# Patient Record
Sex: Male | Born: 1960 | Race: White | Hispanic: No | Marital: Married | Smoking: Former smoker
Health system: Southern US, Community
[De-identification: ages and names within clinical notes are randomized; demographics above are authoritative.]

## PROBLEM LIST (undated history)

## (undated) DIAGNOSIS — Z860100 Personal history of colon polyps, unspecified: Secondary | ICD-10-CM

## (undated) DIAGNOSIS — E7800 Pure hypercholesterolemia, unspecified: Secondary | ICD-10-CM

## (undated) DIAGNOSIS — M5430 Sciatica, unspecified side: Secondary | ICD-10-CM

## (undated) DIAGNOSIS — M109 Gout, unspecified: Secondary | ICD-10-CM

## (undated) DIAGNOSIS — Z860101 Personal history of adenomatous and serrated colon polyps: Secondary | ICD-10-CM

## (undated) DIAGNOSIS — I10 Essential (primary) hypertension: Secondary | ICD-10-CM

## (undated) DIAGNOSIS — E669 Obesity, unspecified: Secondary | ICD-10-CM

## (undated) DIAGNOSIS — R7303 Prediabetes: Secondary | ICD-10-CM

## (undated) DIAGNOSIS — F439 Reaction to severe stress, unspecified: Secondary | ICD-10-CM

## (undated) HISTORY — PX: BASAL CELL CARCINOMA EXCISION: SHX1214

## (undated) HISTORY — DX: Pure hypercholesterolemia, unspecified: E78.00

## (undated) HISTORY — DX: Personal history of adenomatous and serrated colon polyps: Z86.0101

## (undated) HISTORY — PX: CATARACT EXTRACTION: SUR2

## (undated) HISTORY — DX: Personal history of colon polyps, unspecified: Z86.0100

## (undated) HISTORY — DX: Sciatica, unspecified side: M54.30

## (undated) HISTORY — DX: Prediabetes: R73.03

## (undated) HISTORY — DX: Obesity, unspecified: E66.9

## (undated) HISTORY — PX: COLONOSCOPY: SHX174

## (undated) HISTORY — DX: Reaction to severe stress, unspecified: F43.9

## (undated) HISTORY — DX: Essential (primary) hypertension: I10

---

## 2010-11-26 HISTORY — PX: CATARACT EXTRACTION: SUR2

## 2014-01-24 ENCOUNTER — Ambulatory Visit (HOSPITAL_BASED_OUTPATIENT_CLINIC_OR_DEPARTMENT_OTHER)
Admission: RE | Admit: 2014-01-24 | Discharge: 2014-01-24 | Disposition: A | Discharge status: Home / Self Care | Payer: BC Managed Care – PPO | Source: Ambulatory Visit | Attending: Family Medicine | Admitting: Family Medicine

## 2014-01-24 ENCOUNTER — Encounter (HOSPITAL_BASED_OUTPATIENT_CLINIC_OR_DEPARTMENT_OTHER): Payer: Self-pay | Admitting: Emergency Medicine

## 2014-01-24 ENCOUNTER — Other Ambulatory Visit (HOSPITAL_BASED_OUTPATIENT_CLINIC_OR_DEPARTMENT_OTHER): Payer: Self-pay | Admitting: Family Medicine

## 2014-01-24 ENCOUNTER — Emergency Department (HOSPITAL_BASED_OUTPATIENT_CLINIC_OR_DEPARTMENT_OTHER)
Admission: EM | Admit: 2014-01-24 | Discharge: 2014-01-24 | Disposition: A | Discharge status: Home / Self Care | Payer: BC Managed Care – PPO | Attending: Emergency Medicine | Admitting: Emergency Medicine

## 2014-01-24 DIAGNOSIS — M109 Gout, unspecified: Secondary | ICD-10-CM | POA: Diagnosis not present

## 2014-01-24 DIAGNOSIS — M5416 Radiculopathy, lumbar region: Secondary | ICD-10-CM

## 2014-01-24 DIAGNOSIS — M545 Low back pain, unspecified: Secondary | ICD-10-CM | POA: Diagnosis present

## 2014-01-24 DIAGNOSIS — Z79899 Other long term (current) drug therapy: Secondary | ICD-10-CM | POA: Insufficient documentation

## 2014-01-24 DIAGNOSIS — I10 Essential (primary) hypertension: Secondary | ICD-10-CM | POA: Insufficient documentation

## 2014-01-24 DIAGNOSIS — R209 Unspecified disturbances of skin sensation: Secondary | ICD-10-CM | POA: Insufficient documentation

## 2014-01-24 DIAGNOSIS — IMO0002 Reserved for concepts with insufficient information to code with codable children: Secondary | ICD-10-CM | POA: Insufficient documentation

## 2014-01-24 DIAGNOSIS — M5417 Radiculopathy, lumbosacral region: Secondary | ICD-10-CM

## 2014-01-24 HISTORY — DX: Essential (primary) hypertension: I10

## 2014-01-24 HISTORY — DX: Gout, unspecified: M10.9

## 2014-01-24 LAB — URINALYSIS, ROUTINE W REFLEX MICROSCOPIC
Glucose, UA: NEGATIVE mg/dL
HGB URINE DIPSTICK: NEGATIVE
Ketones, ur: 15 mg/dL — AB
Leukocytes, UA: NEGATIVE
NITRITE: NEGATIVE
PROTEIN: NEGATIVE mg/dL
Specific Gravity, Urine: 1.025 (ref 1.005–1.030)
UROBILINOGEN UA: 1 mg/dL (ref 0.0–1.0)
pH: 6 (ref 5.0–8.0)

## 2014-01-24 MED ORDER — OXYCODONE-ACETAMINOPHEN 5-325 MG PO TABS: 1.0000 | ORAL_TABLET | ORAL | Status: AC | PRN

## 2014-01-24 MED ORDER — HYDROMORPHONE HCL 1 MG/ML IJ SOLN
2.0000 mg | Freq: Once | INTRAMUSCULAR | Status: AC
Start: 2014-01-24 — End: 2014-01-24
  Administered 2014-01-24: 2 mg via INTRAMUSCULAR
  Filled 2014-01-24: qty 2

## 2014-01-24 NOTE — Discharge Instructions (Signed)

## 2014-01-24 NOTE — ED Provider Notes (Signed)
CSN: [id number]     Arrival date & time 01/24/14  1059 History   First MD Initiated Contact with Patient 01/24/14 1136     Chief Complaint  Patient presents with  . Back Pain      Patient is a 53 y.o. male presenting with back pain. The history is provided by the patient.  Back Pain Location:  Gluteal region Quality:  Aching Radiates to:  L posterior upper leg Pain severity:  Moderate Onset quality:  Gradual Timing:  Constant Progression:  Worsening Chronicity:  New Relieved by:  Lying down Worsened by:  Ambulation Associated symptoms: numbness   Associated symptoms: no bladder incontinence, no bowel incontinence, no dysuria, no fever and no weakness   Risk factors: no hx of cancer and no recent surgery   Risk factors comment:  Denies IVDA pt reports recent low back pain over past several weeks He saw chiropractor and had "normal" back xrays and had an "adjustment" He also saw PCP who started him on flexeril and anti-inflammatory He reports in past 24 hours pain has worsened and he can not "get comfortable" No weakness in his legs but reports numbness/pain radiating into left LE  No h/o trauma No back surgery   Past Medical History  Diagnosis Date  . Hypertension   . Gout    History reviewed. No pertinent past surgical history. No family history on file. History  Substance Use Topics  . Smoking status: Never Smoker   . Smokeless tobacco: Not on file  . Alcohol Use: Not on file    Review of Systems  Constitutional: Negative for fever.  Gastrointestinal: Negative for vomiting and bowel incontinence.  Genitourinary: Negative for bladder incontinence and dysuria.  Musculoskeletal: Positive for back pain.  Neurological: Positive for numbness. Negative for weakness.  All other systems reviewed and are negative.     Allergies  Review of patient's allergies indicates no known allergies.  Home Medications   Prior to Admission medications   Medication Sig Start  Date End Date Taking? Authorizing Provider  allopurinol (ZYLOPRIM) 100 MG tablet Take 100 mg by mouth daily.   Yes Historical Provider, MD  amLODipine (NORVASC) 5 MG tablet Take 5 mg by mouth daily.   Yes Historical Provider, MD  etodolac (LODINE) 400 MG tablet Take 400 mg by mouth 2 (two) times daily.   Yes Historical Provider, MD  lisinopril (PRINIVIL,ZESTRIL) 40 MG tablet Take 40 mg by mouth daily.   Yes Historical Provider, MD  oxyCODONE-acetaminophen (PERCOCET/ROXICET) 5-325 MG per tablet Take 1 tablet by mouth every 4 (four) hours as needed for severe pain. 01/24/14   Joya Gaskins, MD   BP 131/80  Pulse 71  Temp(Src) 97.8 F (36.6 C) (Oral)  Resp 18  Ht 6' (1.829 m)  Wt 230 lb (104.327 kg)  BMI 31.19 kg/m2  SpO2 97% Physical Exam CONSTITUTIONAL: Well developed/well nourished HEAD: Normocephalic/atraumatic EYES: EOMI/PERRL ENMT: Mucous membranes moist NECK: supple no meningeal signs SPINE:entire spine nontender No bruising/crepitance/stepoffs noted to spine Tenderness in paraspinal region in lumbo/sacral region CV: S1/S2 noted, no murmurs/rubs/gallops noted LUNGS: Lungs are clear to auscultation bilaterally, no apparent distress ABDOMEN: soft, nontender, no rebound or guarding GU:no cva tenderness NEURO: Awake/alert,equal motor 5/5 strength noted with the following: hip flexion/knee flexion/extension, foot dorsi/plantar flexion, great toe extension intact bilaterally, no clonus bilaterally, plantar reflex appropriate (toes downgoing), Equal patellar/achilles reflex noted (2+) in bilateral lower extremities.  Pt is able to ambulate unassisted. EXTREMITIES: pulses normal, full ROM SKIN: warm, color  normal PSYCH: no abnormalities of mood noted   ED Course  Procedures    12:42 PM Pt without acute neuro deficits He is ambulatory but appears uncomfortable I suspect he has lumbosacral radiculopathy Advised to f/u with PCP for outpatient and MRI and expect spine  referral He does not appear to have acute spinal emergency at this time Will defer plain imaging of low back as he has had this recently as outpatient   Labs Review Labs Reviewed  URINALYSIS, ROUTINE W REFLEX MICROSCOPIC - Abnormal; Notable for the following:    Bilirubin Urine LARGE (*)    Ketones, ur 15 (*)    All other components within normal limits    MDM   Final diagnoses:  Lumbosacral radiculopathy    Nursing notes including past medical history and social history reviewed and considered in documentation Labs/vital reviewed and considered     Joya Gaskinsonald W Emmary Culbreath, MD 01/24/14 1243

## 2014-01-24 NOTE — ED Notes (Signed)
C/o lower left back pain. Has been to MD and dx with a form of sciatica. States pain is better at times but pain is worse today. Pain radiates to toes.

## 2014-01-25 HISTORY — PX: SPINAL FUSION: SHX223

## 2014-06-28 ENCOUNTER — Emergency Department (HOSPITAL_COMMUNITY): Payer: BLUE CROSS/BLUE SHIELD

## 2014-06-28 ENCOUNTER — Emergency Department (HOSPITAL_COMMUNITY)
Admission: EM | Admit: 2014-06-28 | Discharge: 2014-06-29 | Disposition: A | Discharge status: Home / Self Care | Payer: BLUE CROSS/BLUE SHIELD | Attending: Emergency Medicine | Admitting: Emergency Medicine

## 2014-06-28 ENCOUNTER — Encounter (HOSPITAL_COMMUNITY): Payer: Self-pay

## 2014-06-28 DIAGNOSIS — I10 Essential (primary) hypertension: Secondary | ICD-10-CM | POA: Insufficient documentation

## 2014-06-28 DIAGNOSIS — R4182 Altered mental status, unspecified: Secondary | ICD-10-CM | POA: Insufficient documentation

## 2014-06-28 DIAGNOSIS — Z79899 Other long term (current) drug therapy: Secondary | ICD-10-CM | POA: Diagnosis not present

## 2014-06-28 DIAGNOSIS — Z791 Long term (current) use of non-steroidal anti-inflammatories (NSAID): Secondary | ICD-10-CM | POA: Insufficient documentation

## 2014-06-28 DIAGNOSIS — M109 Gout, unspecified: Secondary | ICD-10-CM | POA: Insufficient documentation

## 2014-06-28 DIAGNOSIS — R4189 Other symptoms and signs involving cognitive functions and awareness: Secondary | ICD-10-CM

## 2014-06-28 LAB — CBC WITH DIFFERENTIAL/PLATELET
BASOS ABS: 0 10*3/uL (ref 0.0–0.1)
BASOS PCT: 1 % (ref 0–1)
EOS ABS: 0.1 10*3/uL (ref 0.0–0.7)
Eosinophils Relative: 2 % (ref 0–5)
HCT: 43.6 % (ref 39.0–52.0)
Hemoglobin: 15.5 g/dL (ref 13.0–17.0)
Lymphocytes Relative: 40 % (ref 12–46)
Lymphs Abs: 2.1 10*3/uL (ref 0.7–4.0)
MCH: 32.6 pg (ref 26.0–34.0)
MCHC: 35.6 g/dL (ref 30.0–36.0)
MCV: 91.8 fL (ref 78.0–100.0)
MONOS PCT: 10 % (ref 3–12)
Monocytes Absolute: 0.5 10*3/uL (ref 0.1–1.0)
Neutro Abs: 2.5 10*3/uL (ref 1.7–7.7)
Neutrophils Relative %: 47 % (ref 43–77)
Platelets: 214 10*3/uL (ref 150–400)
RBC: 4.75 MIL/uL (ref 4.22–5.81)
RDW: 13.3 % (ref 11.5–15.5)
WBC: 5.2 10*3/uL (ref 4.0–10.5)

## 2014-06-28 LAB — COMPREHENSIVE METABOLIC PANEL WITH GFR
ALT: 23 U/L (ref 0–53)
AST: 29 U/L (ref 0–37)
Albumin: 4.2 g/dL (ref 3.5–5.2)
Alkaline Phosphatase: 41 U/L (ref 39–117)
Anion gap: 7 (ref 5–15)
BUN: 14 mg/dL (ref 6–23)
CO2: 26 mmol/L (ref 19–32)
Calcium: 9.5 mg/dL (ref 8.4–10.5)
Chloride: 102 mmol/L (ref 96–112)
Creatinine, Ser: 1.01 mg/dL (ref 0.50–1.35)
GFR calc Af Amer: >90 mL/min
GFR calc non Af Amer: 84 mL/min — ABNORMAL LOW
Glucose, Bld: 126 mg/dL — ABNORMAL HIGH (ref 70–99)
Potassium: 4 mmol/L (ref 3.5–5.1)
Sodium: 135 mmol/L (ref 135–145)
Total Bilirubin: 0.6 mg/dL (ref 0.3–1.2)
Total Protein: 7 g/dL (ref 6.0–8.3)

## 2014-06-28 LAB — I-STAT TROPONIN, ED: Troponin i, poc: 0.01 ng/mL (ref 0.00–0.08)

## 2014-06-28 LAB — URINALYSIS, ROUTINE W REFLEX MICROSCOPIC
Bilirubin Urine: NEGATIVE
Glucose, UA: NEGATIVE mg/dL
HGB URINE DIPSTICK: NEGATIVE
Ketones, ur: NEGATIVE mg/dL
Leukocytes, UA: NEGATIVE
Nitrite: NEGATIVE
PROTEIN: NEGATIVE mg/dL
Specific Gravity, Urine: 1.007 (ref 1.005–1.030)
UROBILINOGEN UA: 0.2 mg/dL (ref 0.0–1.0)
pH: 5.5 (ref 5.0–8.0)

## 2014-06-28 LAB — RAPID URINE DRUG SCREEN, HOSP PERFORMED
Amphetamines: NOT DETECTED
Barbiturates: NOT DETECTED
Benzodiazepines: NOT DETECTED
Cocaine: NOT DETECTED
Opiates: NOT DETECTED
Tetrahydrocannabinol: NOT DETECTED

## 2014-06-28 LAB — ETHANOL: Alcohol, Ethyl (B): 288 mg/dL — ABNORMAL HIGH (ref 0–9)

## 2014-06-28 NOTE — ED Notes (Signed)
Brandon at the bedside obtaining EKG and I-Stat Troponin.

## 2014-06-28 NOTE — ED Provider Notes (Signed)
CSN: [id number]     Arrival date & time 06/28/14  2103 History   First MD Initiated Contact with Patient 06/28/14 2114     Chief Complaint  Patient presents with  . Altered Mental Status     (Consider location/radiation/quality/duration/timing/severity/associated sxs/prior Treatment) HPI Comments: Patient is a 54 year old male with a past medical history of hypertension and gout who presents via EMS after an altered mental status episode that occurred prior to arrival. Patient's wife and son area present who contribute to the history. Patient's wife reports the family was sitting down at the table to have dinner when the patient started having "eye twitching" of both eyes. She reports he appeared to be "nodding off to sleep" but his eyes were still open and he had a persistent leftward gaze. He was unresponsive to verbal stimuli at the time. This episode lasted about 10 minutes. EMS arrive shortly after and patient appeared to have some gait ataxia and mild confusion but was now responsive to verbal stimuli. Patient did not remember anything. During the episode, patient grabbed his chest as if he was in pain. Patient denies any symptoms at this time. Patient also reports drinking 2-4 beers tonight which is typical for him. No aggravating/alleviating factors. No other associated symptoms.    Past Medical History  Diagnosis Date  . Hypertension   . Gout    History reviewed. No pertinent past surgical history. History reviewed. No pertinent family history. History  Substance Use Topics  . Smoking status: Never Smoker   . Smokeless tobacco: Not on file  . Alcohol Use: Not on file    Review of Systems  Constitutional: Negative for fever, chills and fatigue.  HENT: Negative for trouble swallowing.   Eyes: Negative for visual disturbance.  Respiratory: Negative for shortness of breath.   Cardiovascular: Negative for chest pain and palpitations.  Gastrointestinal: Negative for nausea,  vomiting, abdominal pain and diarrhea.  Genitourinary: Negative for dysuria and difficulty urinating.  Musculoskeletal: Negative for arthralgias and neck pain.  Skin: Negative for color change.  Neurological: Negative for dizziness and weakness.       Altered mental status  Psychiatric/Behavioral: Negative for dysphoric mood.      Allergies  Review of patient's allergies indicates no known allergies.  Home Medications   Prior to Admission medications   Medication Sig Start Date End Date Taking? Authorizing Provider  allopurinol (ZYLOPRIM) 100 MG tablet Take 100 mg by mouth daily.    Historical Provider, MD  amLODipine (NORVASC) 5 MG tablet Take 5 mg by mouth daily.    Historical Provider, MD  etodolac (LODINE) 400 MG tablet Take 400 mg by mouth 2 (two) times daily.    Historical Provider, MD  lisinopril (PRINIVIL,ZESTRIL) 40 MG tablet Take 40 mg by mouth daily.    Historical Provider, MD  oxyCODONE-acetaminophen (PERCOCET/ROXICET) 5-325 MG per tablet Take 1 tablet by mouth every 4 (four) hours as needed for severe pain. 01/24/14   Joya Gaskinsonald W Wickline, MD   BP 114/75 mmHg  Pulse 113  Resp 21  Ht 6' (1.829 m)  Wt 228 lb (103.42 kg)  BMI 30.92 kg/m2  SpO2 96% Physical Exam  Constitutional: He is oriented to person, place, and time. He appears well-developed and well-nourished. No distress.  HENT:  Head: Normocephalic and atraumatic.  Mouth/Throat: Oropharynx is clear and moist. No oropharyngeal exudate.  Eyes: Conjunctivae and EOM are normal. Pupils are equal, round, and reactive to light.  Persistent nystagmus in all directions with  EOM.   Neck: Normal range of motion. Neck supple.  Cardiovascular: Normal rate and regular rhythm.  Exam reveals no gallop and no friction rub.   No murmur heard. Pulmonary/Chest: Effort normal and breath sounds normal. He has no wheezes. He has no rales. He exhibits no tenderness.  Abdominal: Soft. He exhibits no distension. There is no tenderness.  There is no rebound.  Musculoskeletal: Normal range of motion.  Lymphadenopathy:    He has no cervical adenopathy.  Neurological: He is alert and oriented to person, place, and time. No cranial nerve deficit. Coordination normal.  Speech is goal-oriented. Moves limbs without ataxia. Patient is able to ambulate without difficulty.   Skin: Skin is warm and dry.  Psychiatric: He has a normal mood and affect. His behavior is normal.  Nursing note and vitals reviewed.   ED Course  Procedures (including critical care time) Labs Review Labs Reviewed  COMPREHENSIVE METABOLIC PANEL - Abnormal; Notable for the following:    Glucose, Bld 126 (*)    GFR calc non Af Amer 84 (*)    All other components within normal limits  ETHANOL - Abnormal; Notable for the following:    Alcohol, Ethyl (B) 288 (*)    All other components within normal limits  CBC WITH DIFFERENTIAL/PLATELET  URINALYSIS, ROUTINE W REFLEX MICROSCOPIC  URINE RAPID DRUG SCREEN (HOSP PERFORMED)  I-STAT TROPOININ, ED    Imaging Review Ct Head Wo Contrast  06/28/2014   CLINICAL DATA:  Recent syncopal event  EXAM: CT HEAD WITHOUT CONTRAST  TECHNIQUE: Contiguous axial images were obtained from the base of the skull through the vertex without intravenous contrast.  COMPARISON:  None.  FINDINGS: The bony calvarium is intact. The ventricles are of normal size and configuration. No findings to suggest acute hemorrhage, acute infarction or space-occupying mass lesion are noted.  IMPRESSION: No acute intracranial abnormality noted.   Electronically Signed   By: Alcide Clever M.D.   On: 06/28/2014 22:21     EKG Interpretation   Date/Time:  Thursday June 28 2014 22:59:42 EST Ventricular Rate:  87 PR Interval:  174 QRS Duration: 140 QT Interval:  390 QTC Calculation: 469 R Axis:   -27 Text Interpretation:  Sinus rhythm Right bundle branch block Confirmed by  Erroll Luna [phone]) on 06/28/2014 11:02:21 PM      MDM   Final  diagnoses:  Episode of altered cognition    10:34 PM Vitals stable and patient afebrile. No neuro deficits noted at this time.   11:38 PM Labs and CT unremarkable for acute changes. I spoke with Dr. Hosie Poisson about the patient who states he can follow up as an outpatient. Patient instructed to return with worsening or concerning symptoms.    Emilia Beck, PA-C 06/29/14 0155  Gwyneth Sprout, MD 06/29/14 2240

## 2014-06-28 NOTE — Discharge Instructions (Signed)
Follow up with Dr. Anne HahnWillis for further evaluation. Call to make an appointment. Refer to attached documents for more information. Return to the ED with worsening or concerning symptoms.

## 2014-06-28 NOTE — ED Notes (Signed)
EMS reports that pt drank aprox. 2-4 beers this evening and was sitting at dinner table when he "spaced out for like ten minutes with no recollection of doing so."  Pt wife reports that he has had episodes like this in the past but not to this extent.  Pt was extremely agitated when he came back around and did not recall any of the event.  Pt refused tx PTA.  Pt was able to ambulate w/o difficulty to ED room.

## 2014-07-12 ENCOUNTER — Ambulatory Visit: Payer: BLUE CROSS/BLUE SHIELD | Admitting: Neurology

## 2016-02-23 IMAGING — MR MR LUMBAR SPINE W/O CM
4 of 5 series · 18 of 48 positions shown · non-contrast
Comparison: None.

CLINICAL DATA: New onset of low back pain radiating into the left
buttock and leg with numbness for 3 weeks. No acute injury or prior
relevant surgery.

EXAM:
MRI LUMBAR SPINE WITHOUT CONTRAST
TECHNIQUE: Multiplanar, multisequence MR imaging of the lumbar spine was
performed. No intravenous contrast was administered.

[Series 3: T2 · sagittal · 4.0mm · 0.51mm/px · 6 of 14 slices shown (1 of 2)]
[im 1/14]
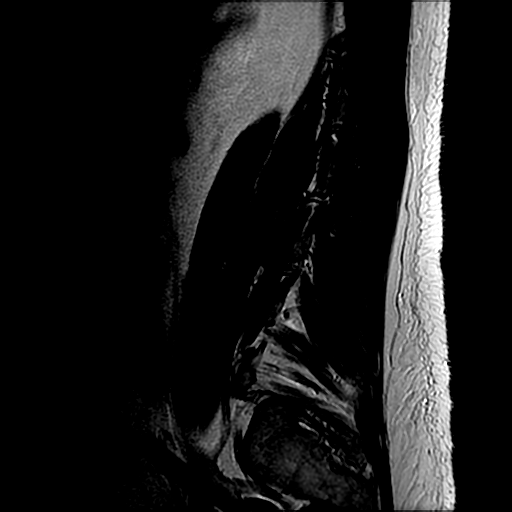
[im 3/14]
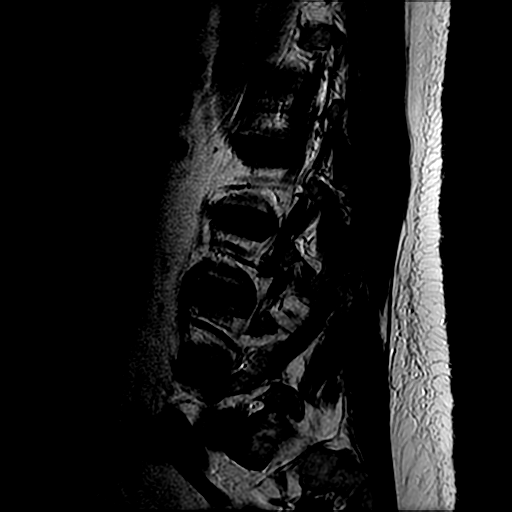
[im 6/14]
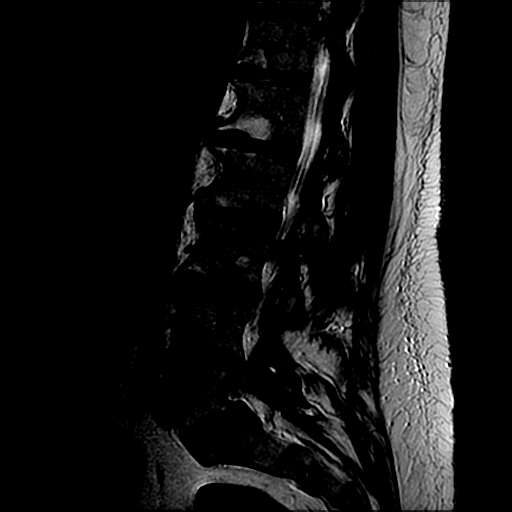
[im 8/14]
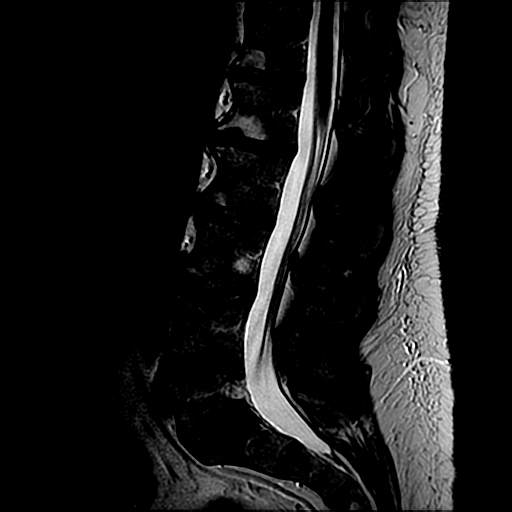
[im 11/14]
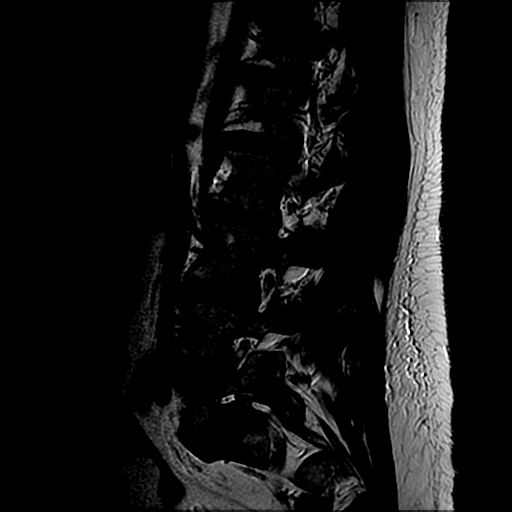
[im 14/14]
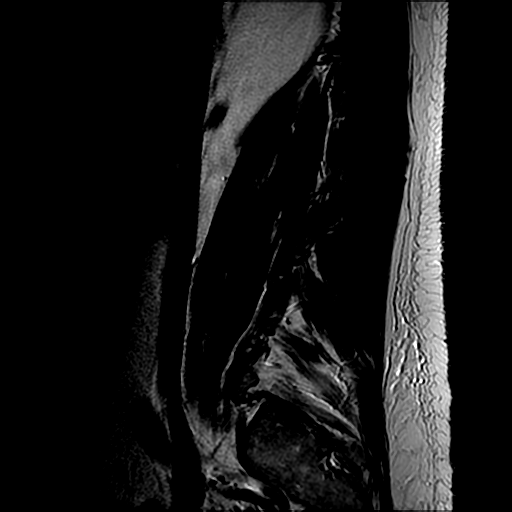

[Series 4: T1 · sagittal · 4.0mm · 0.51mm/px · 3 of 14 slices shown (1 of 2)]
[im 3/14]
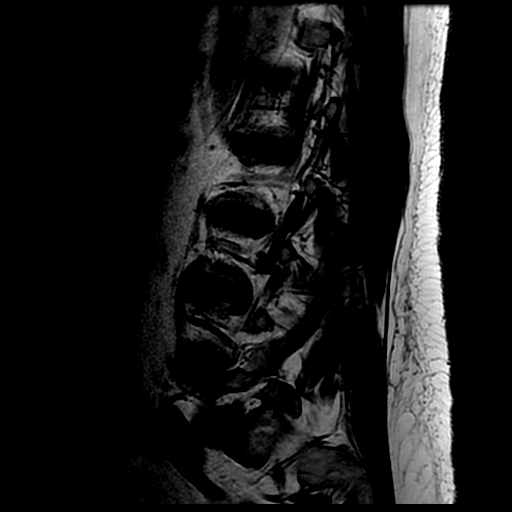
[im 8/14]
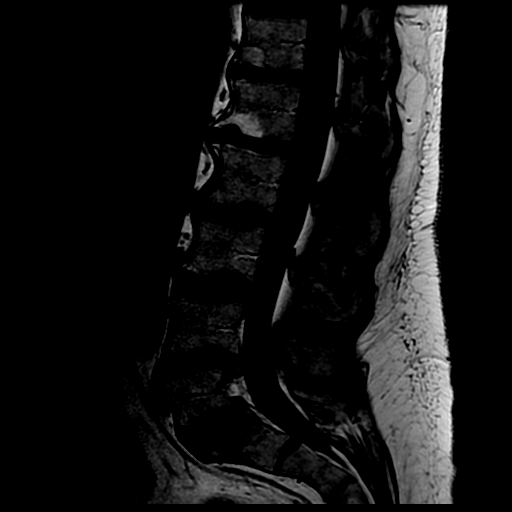
[im 14/14]
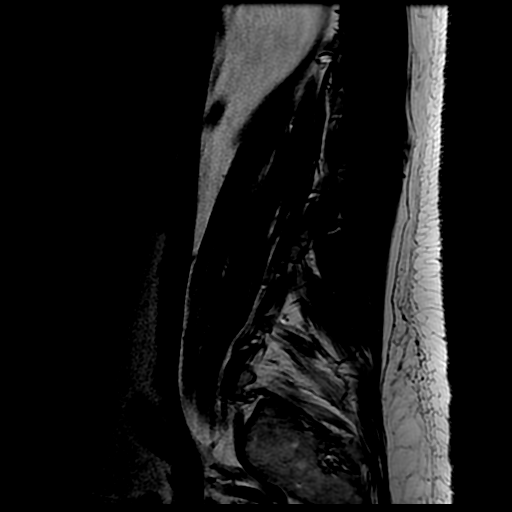

[Series 6: T2 · axial · 4.0mm · 0.37mm/px · z∈[-26,+165]mm · 6 of 35 slices shown (2 of 2)]
[im 1/35]
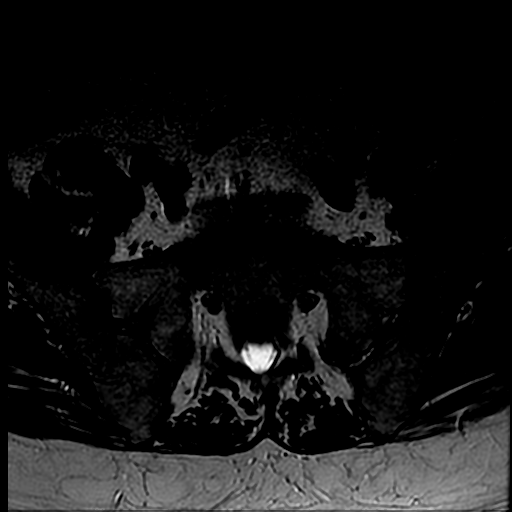
[im 5/35]
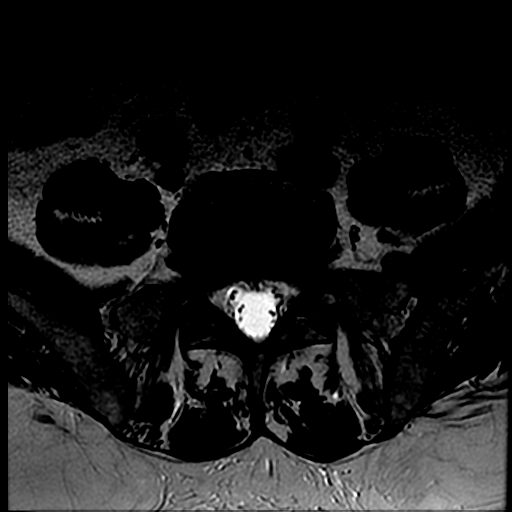
[im 10/35]
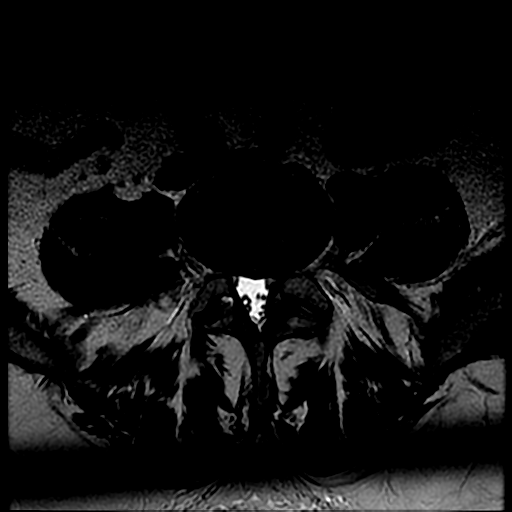
[im 15/35]
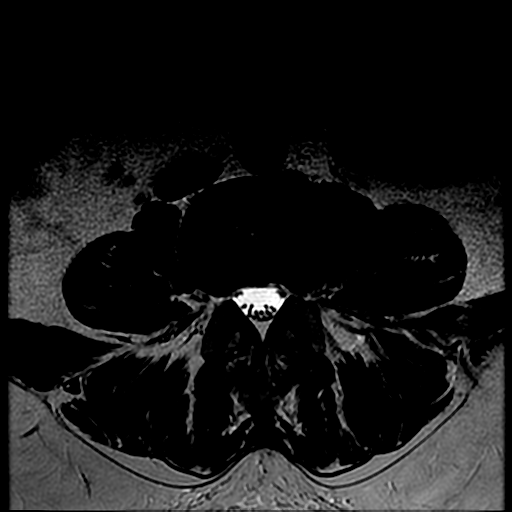
[im 18/35]
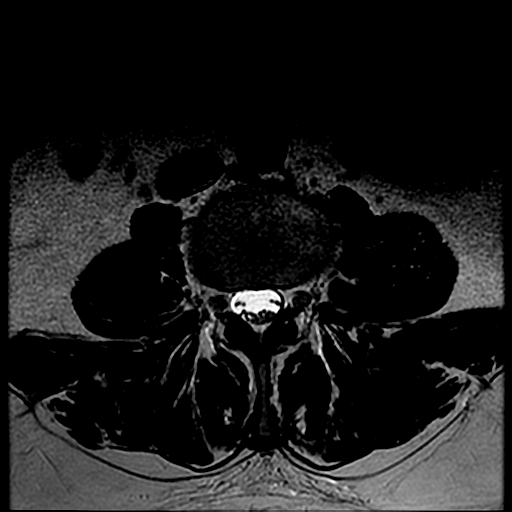
[im 30/35]
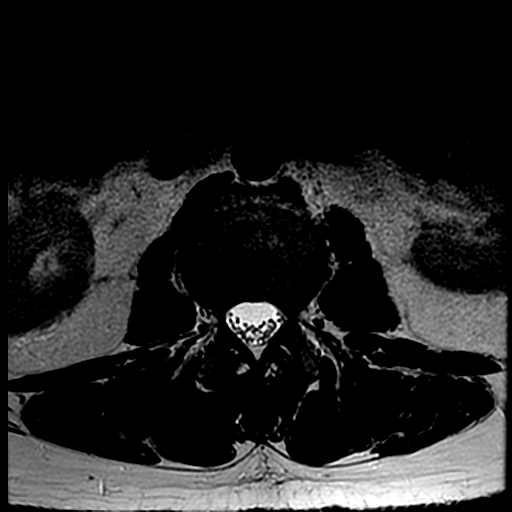

[Series 7: T1 · axial · 4.0mm · 0.37mm/px · z∈[-6,+165]mm · 3 of 35 slices shown (2 of 2)]
[im 5/35]
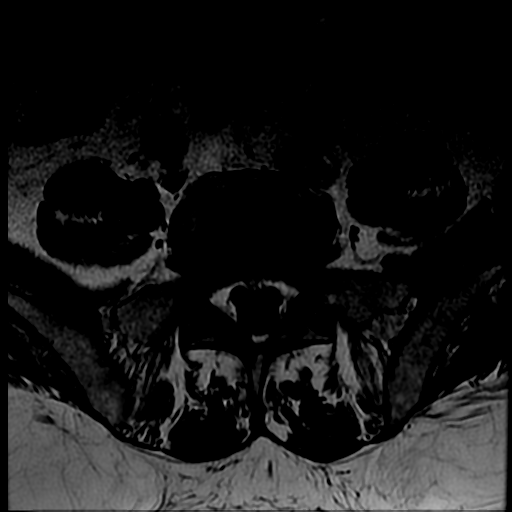
[im 18/35]
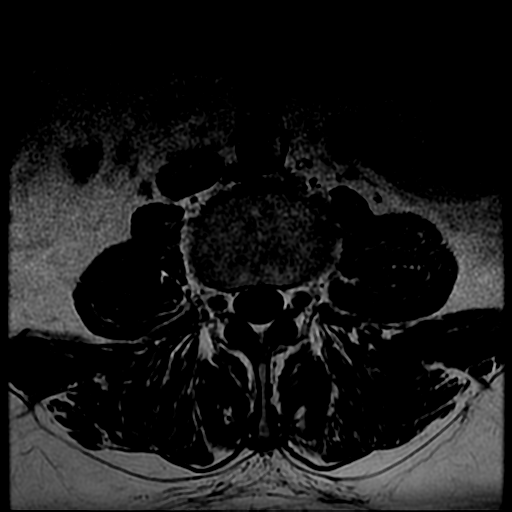
[im 30/35]
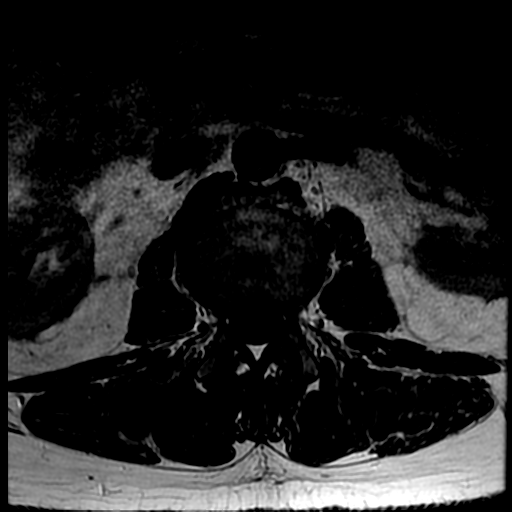

[18 of 48 positions shown; findings below may reference images not displayed]

FINDINGS: Five lumbar type vertebral bodies are assumed. There is 5 mm of
anterolisthesis L5-S1 secondary to bilateral L5 pars defects. There
is a slight retrolisthesis at L4-5. There are scattered endplate
degenerative changes throughout the lumbar spine. There is no
evidence of acute fracture.

The conus medullaris extends to the L1 level and appears normal. No
paraspinal abnormalities are identified.

L1-2: Mild loss of disc height with disc bulging and anterior
osteophytes. No spinal stenosis or nerve root encroachment.

L2-3: Mild loss of disc height with disc bulging and anterior
osteophytes. No spinal stenosis or nerve root encroachment.

L3-4: Disc height is maintained. There is mild disc bulging and
facet hypertrophy. No spinal stenosis or nerve root encroachment.

L4-5: There is disc bulging with a left paracentral disc extrusion.
There is caudal migration of a moderate-sized disc fragment into the
left L5 lateral recess. Although largely contained within the
ventral epidural fat, this likely encroaches on the left L5 nerve
root. There is mild bilateral facet hypertrophy. The L4 nerve roots
exit freely.

L5-S1: Due to the pars defects, resulting anterolisthesis and
underlying disc bulging, there is moderate right and mild left
foraminal stenosis. Right L5 nerve root encroachment is possible.
The spinal canal and lateral recesses appear normal. The facet
joints demonstrate no significant abnormality.
IMPRESSION: 1. Extruded disc fragment at L4-5 with caudal migration into the
left L5 lateral recess. There is probable resulting left L5 nerve
root encroachment accounting for the patient's left lower extremity
radicular symptoms.
2. Underlying bilateral L5 pars defects with resulting grade 1
anterolisthesis at L5-S1. There is moderate right and mild left
foraminal stenosis with possible right L5 nerve root encroachment.
3. No other significant disc space findings.

## 2016-07-27 IMAGING — CT CT HEAD W/O CM
1 series · 16 of 30 positions shown, 20 images · non-contrast
Comparison: None.

CLINICAL DATA: Recent syncopal event

EXAM:
CT HEAD WITHOUT CONTRAST
TECHNIQUE: Contiguous axial images were obtained from the base of the skull
through the vertex without intravenous contrast.

[Series 2: head 5.0 h30s · axial · 0.46mm/px · z∈[-166,-11]mm · 16 of 35 slices shown, 20 images]
[im 2/35  brain]
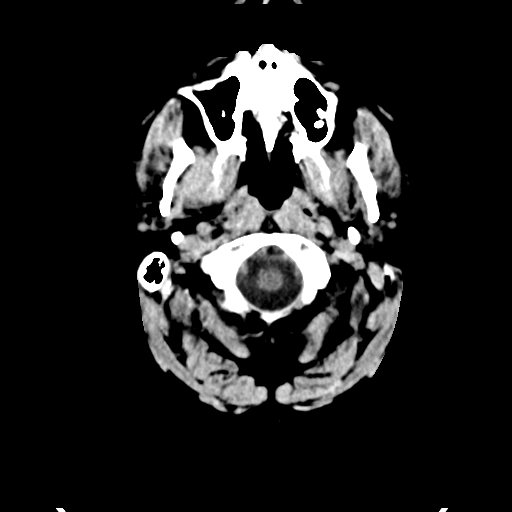
[im 2/35  bone]
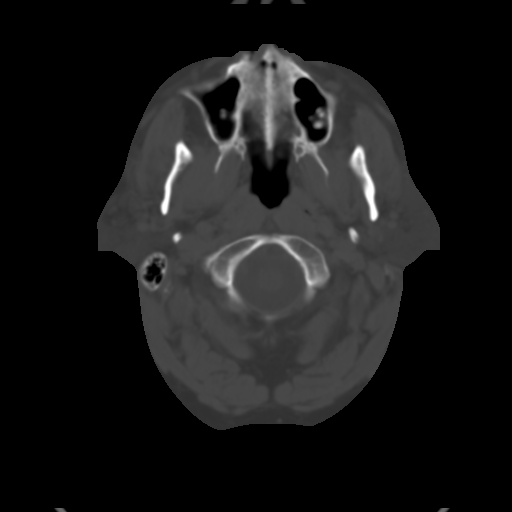
[im 4/35  brain]
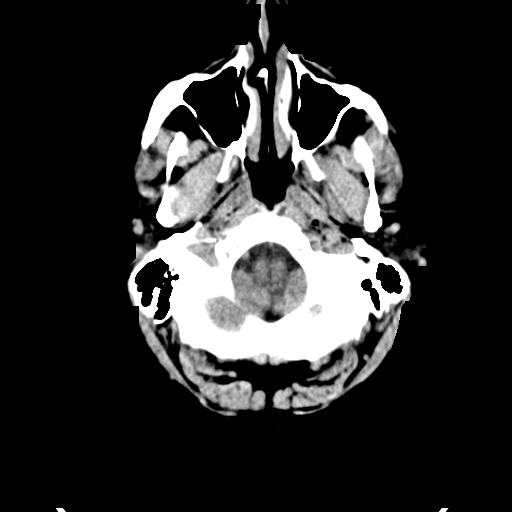
[im 6/35  brain]
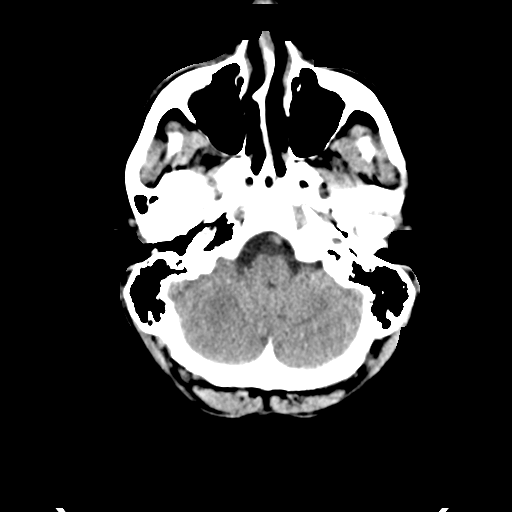
[im 9/35  brain]
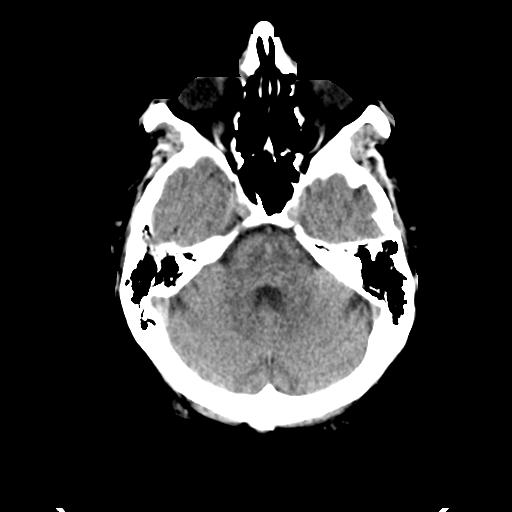
[im 10/35  brain]
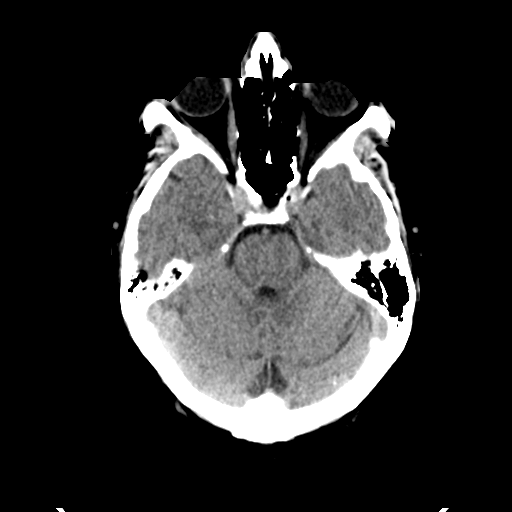
[im 10/35  bone]
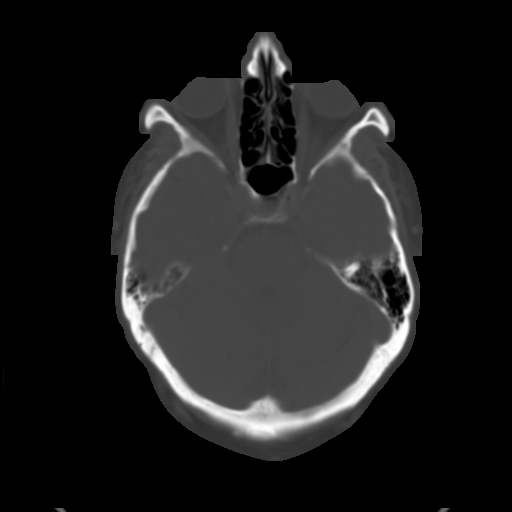
[im 12/35  brain]
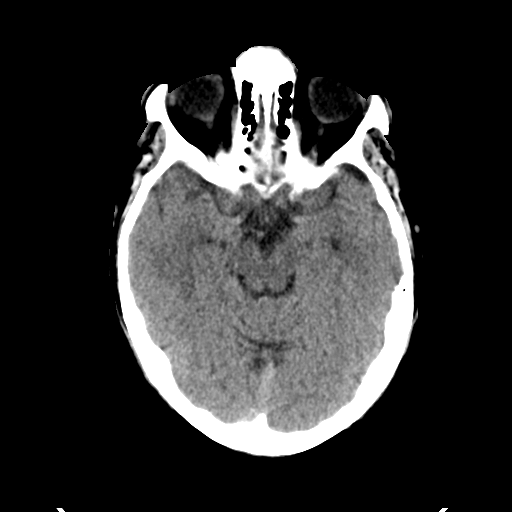
[im 15/35  brain]
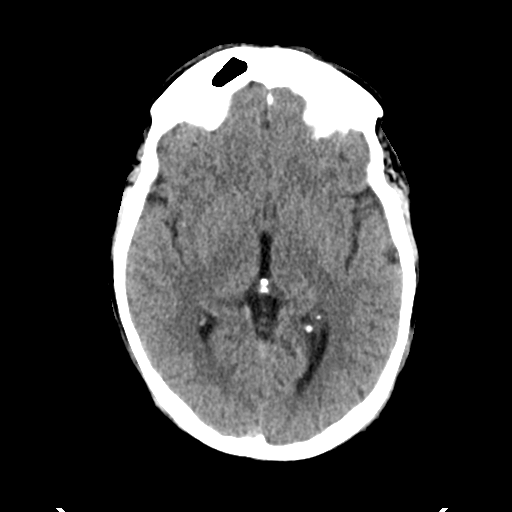
[im 17/35  brain]
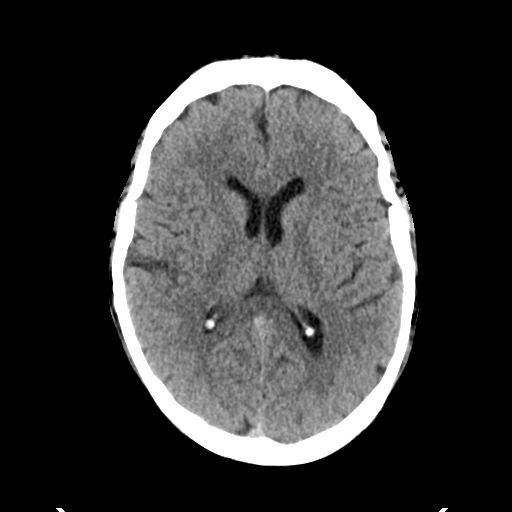
[im 18/35  brain]
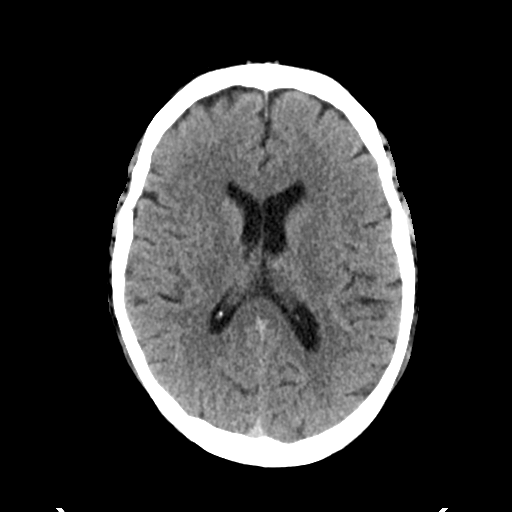
[im 18/35  bone]
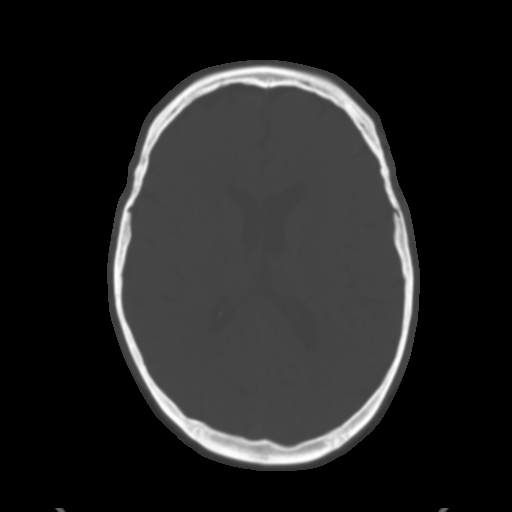
[im 20/35  brain]
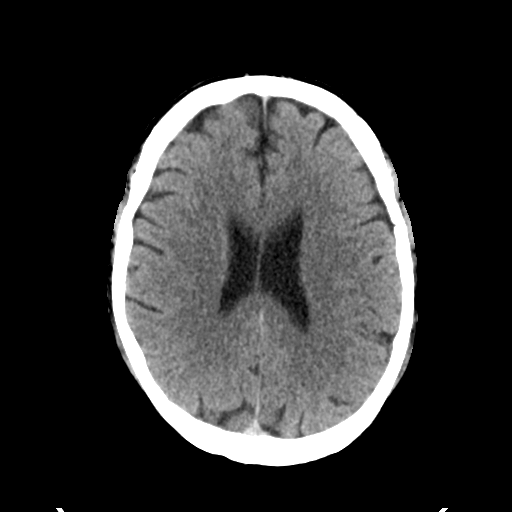
[im 23/35  brain]
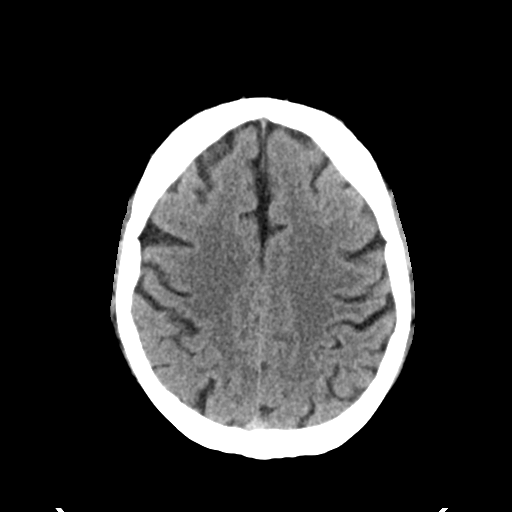
[im 25/35  brain]
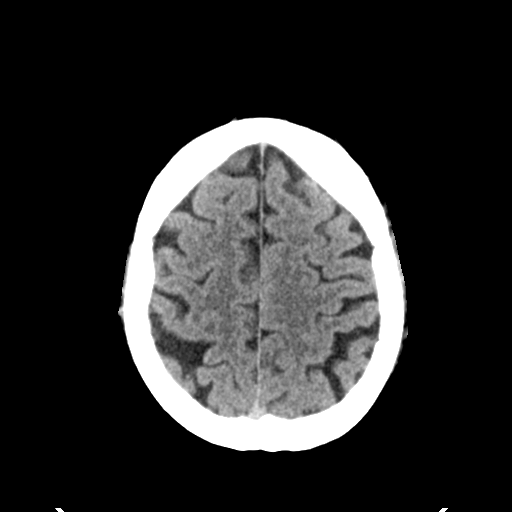
[im 26/35  brain]
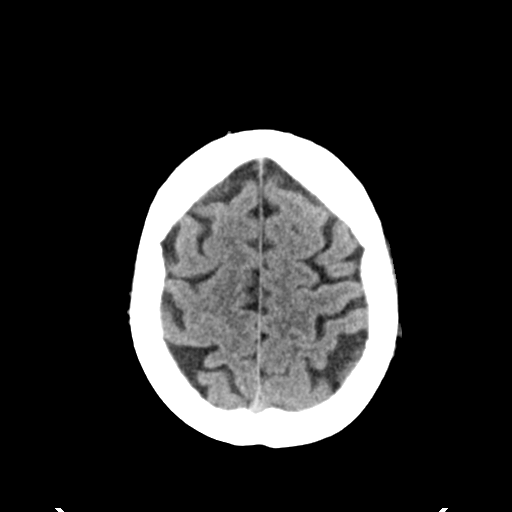
[im 26/35  bone]
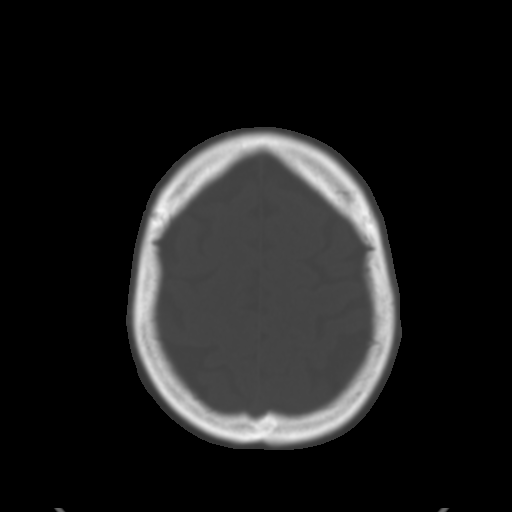
[im 29/35  brain]
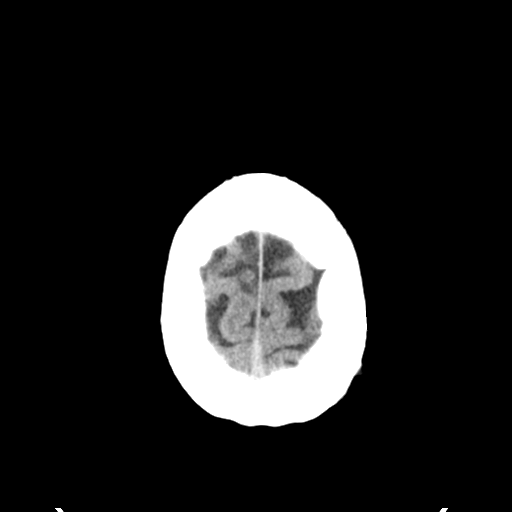
[im 31/35  brain]
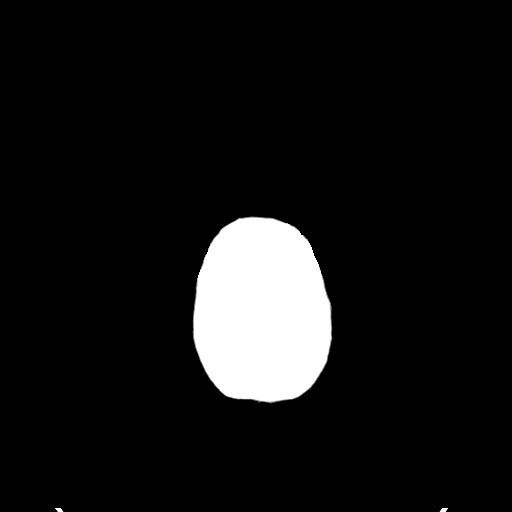
[im 33/35  brain]
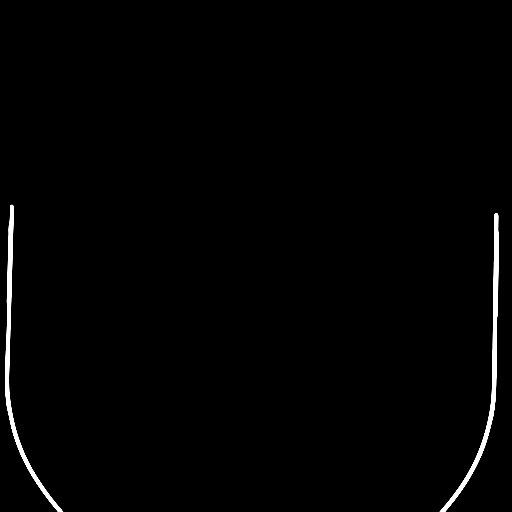

[16 of 30 positions shown; findings below may reference images not displayed]

FINDINGS: The bony calvarium is intact. The ventricles are of normal size and
configuration. No findings to suggest acute hemorrhage, acute
infarction or space-occupying mass lesion are noted.
IMPRESSION: No acute intracranial abnormality noted.

## 2016-11-25 HISTORY — PX: CERVICAL FUSION: SHX112

## 2019-06-25 ENCOUNTER — Ambulatory Visit: Payer: BLUE CROSS/BLUE SHIELD | Attending: Internal Medicine

## 2019-06-25 DIAGNOSIS — Z23 Encounter for immunization: Secondary | ICD-10-CM

## 2019-06-25 NOTE — Progress Notes (Signed)
   Covid-19 Vaccination Clinic  Name:  Tyler Calhoun    MRN: [id number] DOB: 1960-11-20  06/25/2019  [name] was observed post Covid-19 immunization for 15 minutes without incidence. He was provided with Vaccine Information Sheet and instruction to access the V-Safe system.   [name] was instructed to call 911 with any severe reactions post vaccine: Marland Kitchen Difficulty breathing  . Swelling of your face and throat  . A fast heartbeat  . A bad rash all over your body  . Dizziness and weakness    Immunizations Administered    Name Date Dose VIS Date Route   Moderna COVID-19 Vaccine 06/25/2019  2:55 PM 0.5 mL 03/28/2019 Intramuscular   Manufacturer: Moderna   Lot: [id number]   NDC: 43200-379-44

## 2019-07-29 ENCOUNTER — Ambulatory Visit: Payer: BLUE CROSS/BLUE SHIELD | Attending: Internal Medicine

## 2019-07-29 DIAGNOSIS — Z23 Encounter for immunization: Secondary | ICD-10-CM

## 2019-07-29 NOTE — Progress Notes (Signed)
   Covid-19 Vaccination Clinic  Name:  Tyler Calhoun    MRN: [id number] DOB: February 12, 1961  07/29/2019  Mr. Renner was observed post Covid-19 immunization for 15 minutes without incident. He was provided with Vaccine Information Sheet and instruction to access the V-Safe system.   Mr. Ghuman was instructed to call 911 with any severe reactions post vaccine: Marland Kitchen Difficulty breathing  . Swelling of face and throat  . A fast heartbeat  . A bad rash all over body  . Dizziness and weakness   Immunizations Administered    Name Date Dose VIS Date Route   Moderna COVID-19 Vaccine 07/29/2019  2:41 PM 0.5 mL 03/28/2019 Intramuscular   Manufacturer: Moderna   Lot: [id number]   NDC: 61443-154-00

## 2020-04-27 HISTORY — PX: YAG LASER APPLICATION: SHX6189

## 2020-08-30 HISTORY — PX: RETINAL DETACHMENT SURGERY: SHX105

## 2021-02-23 HISTORY — PX: RETINAL DETACHMENT SURGERY: SHX105

## 2023-06-24 ENCOUNTER — Other Ambulatory Visit (HOSPITAL_BASED_OUTPATIENT_CLINIC_OR_DEPARTMENT_OTHER): Payer: Self-pay | Admitting: Family Medicine

## 2023-06-24 DIAGNOSIS — I6522 Occlusion and stenosis of left carotid artery: Secondary | ICD-10-CM

## 2023-06-30 ENCOUNTER — Ambulatory Visit (HOSPITAL_BASED_OUTPATIENT_CLINIC_OR_DEPARTMENT_OTHER)
Admission: RE | Admit: 2023-06-30 | Discharge: 2023-06-30 | Disposition: A | Discharge status: Home / Self Care | Payer: 59 | Source: Ambulatory Visit | Attending: Family Medicine | Admitting: Family Medicine

## 2023-06-30 DIAGNOSIS — I6522 Occlusion and stenosis of left carotid artery: Secondary | ICD-10-CM | POA: Insufficient documentation

## 2023-06-30 MED ORDER — IOHEXOL 350 MG/ML SOLN
75.0000 mL | Freq: Once | INTRAVENOUS | Status: AC | PRN
  Administered 2023-06-30: 75 mL via INTRAVENOUS

## 2023-07-22 ENCOUNTER — Other Ambulatory Visit (HOSPITAL_BASED_OUTPATIENT_CLINIC_OR_DEPARTMENT_OTHER): Payer: Self-pay | Admitting: Family Medicine

## 2023-07-22 DIAGNOSIS — I700 Atherosclerosis of aorta: Secondary | ICD-10-CM

## 2023-07-30 ENCOUNTER — Telehealth (HOSPITAL_BASED_OUTPATIENT_CLINIC_OR_DEPARTMENT_OTHER): Payer: Self-pay

## 2023-08-04 ENCOUNTER — Telehealth (HOSPITAL_BASED_OUTPATIENT_CLINIC_OR_DEPARTMENT_OTHER): Payer: Self-pay

## 2023-08-18 ENCOUNTER — Ambulatory Visit (HOSPITAL_BASED_OUTPATIENT_CLINIC_OR_DEPARTMENT_OTHER)
Admission: RE | Admit: 2023-08-18 | Discharge: 2023-08-18 | Disposition: A | Discharge status: Home / Self Care | Payer: Self-pay | Source: Ambulatory Visit | Attending: Family Medicine | Admitting: Family Medicine

## 2023-08-18 DIAGNOSIS — I700 Atherosclerosis of aorta: Secondary | ICD-10-CM | POA: Insufficient documentation

## 2024-02-02 NOTE — Progress Notes (Signed)
 Cardiology Office Note:  .   Date:  02/03/2024  ID:  Tyler Calhoun, DOB Aug 16, 1960, MRN [id number] PCP: Frederik Charleston, MD  [facility] Providers Cardiologist:  Shelda Bruckner, MD {  History of Present Illness: .   Tyler Calhoun is a 63 y.o. male with PMH hypertension, coronary artery calcification, hypercholesterolemia who is seen as a new patient consultation for aortic atherosclerosis at the request of Dr. Frederik.  Referral from 09/22/23 reviewed. Noted to have carotid doppler concerning for hemodynamically significant stenosis at left carotid bifurcation. Underwent CT angio 07/19/23 without significant disease. Aortic atherosclerosis listed on report. Also had calcium score with score of 189 (75th %ile). Clinic note from 06/21/23 reviewed, presented for annual visit, no specific cardiac concerns mentioned at that time.   Patient notes that his retina specialist was concerned that his eyes may not be getting enough blood flow, hence the initial carotid evaluation. He also notes history of alcoholism, stopped drinking April 28 2023. He is concerned about the damage he has done to his body long term.  He has not had prior MI or CVA. Has been on statin since earlier this year, recheck in July showed Tchol 147, HDL 55, LDL 75, TG 86. Was not on statin at the time of his calcium score. Has been on blood pressure medicine for over 10 years. 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 FH: grandfather died of MI and CVA, no other history he is aware of.  Feels the best he has in 10 years. Walks a mile daily, does intense yard work about twice/week. No limitations. Eats a lot of fruits and vegetables, but also eats a lot of junk food/snacks between meals. Meals are healthy, doesn't eat out much.  ROS: Denies chest pain, shortness of breath at rest or with normal exertion. No PND, orthopnea, LE edema or unexpected weight gain. No syncope or palpitations. ROS otherwise negative except as noted.    Studies Reviewed: SABRA    EKG:  EKG Interpretation Date/Time:  Thursday February 03 2024 10:10:07 EDT Ventricular Rate:  70 PR Interval:  202 QRS Duration:  134 QT Interval:  396 QTC Calculation: 427 R Axis:   -38  Text Interpretation: Normal sinus rhythm Left axis deviation Right bundle branch block Confirmed by Bruckner Shelda [id number]) on 02/03/2024 10:20:52 AM    Physical Exam:   VS:  BP 114/78   Pulse 70   Ht 6' (1.829 m)   Wt 242 lb (109.8 kg)   SpO2 95%   BMI 32.82 kg/m    Wt Readings from Last 3 Encounters:  02/03/24 242 lb (109.8 kg)  06/28/14 228 lb (103.4 kg)  01/24/14 230 lb (104.3 kg)    GEN: Well nourished, well developed in no acute distress HEENT: Normal, moist mucous membranes NECK: No JVD CARDIAC: regular rhythm, normal S1 and S2, no rubs or gallops. No murmur. VASCULAR: Radial and DP pulses 2+ bilaterally. No carotid bruits RESPIRATORY:  Clear to auscultation without rales, wheezing or rhonchi  ABDOMEN: Soft, non-tender, non-distended MUSCULOSKELETAL:  Ambulates independently SKIN: Warm and dry, no edema NEUROLOGIC:  Alert and oriented x 3. No focal neuro deficits noted. PSYCHIATRIC:  Normal affect    ASSESSMENT AND PLAN: .    Aortic atherosclerosis Nonobstructive coronary artery disease based on coronary artery calcification Hypercholesterolemia -reviewed his calcium score, carotid evaluation test results -has been started on atorvastatin, cholesterol near goal of LDL less than 70. Discussed either increasing atorvastatin to 20 mg or trying to cut out  some of the junk foods. After shared decision making, he will work on focusing on lifestyle change and see if this gets him to LDL <70 (which is completely reasonable). Due for lipids at his annual physical in February. If not at goal at that time, he would be open to increasing atorvastatin dose -on aspirin 81 mg daily, tolerating.  -we reviewed red flag warning signs that need immediate medical  attention  Hypertension -well controlled -continue amlodipine, lisinopril  CV risk counseling and prevention -recommend heart healthy/Mediterranean diet, with whole grains, fruits, vegetable, fish, lean meats, nuts, and olive oil. Limit salt. -recommend moderate walking, 3-5 times/week for 30-50 minutes each session. Aim for at least 150 minutes/week. Goal should be pace of 3 miles/hours, or walking 1.5 miles in 30 minutes -recommend avoidance of tobacco products. Avoid excess alcohol.  Dispo: I would be happy to see him back as needed  Signed, Shelda Bruckner, MD   Shelda Bruckner, MD, PhD, [facility]  [facility]  [place]  [facility] at [facility] at [facility] [address], [address] [place], [place] [address] [phone]

## 2024-02-03 ENCOUNTER — Ambulatory Visit (INDEPENDENT_AMBULATORY_CARE_PROVIDER_SITE_OTHER): Admitting: Cardiology

## 2024-02-03 ENCOUNTER — Encounter (HOSPITAL_BASED_OUTPATIENT_CLINIC_OR_DEPARTMENT_OTHER): Payer: Self-pay | Admitting: Cardiology

## 2024-02-03 VITALS — BP 114/78 | HR 70 | Ht 72.0 in | Wt 242.0 lb

## 2024-02-03 DIAGNOSIS — I700 Atherosclerosis of aorta: Secondary | ICD-10-CM

## 2024-02-03 DIAGNOSIS — Z7189 Other specified counseling: Secondary | ICD-10-CM

## 2024-02-03 DIAGNOSIS — I10 Essential (primary) hypertension: Secondary | ICD-10-CM

## 2024-02-03 DIAGNOSIS — I2510 Atherosclerotic heart disease of native coronary artery without angina pectoris: Secondary | ICD-10-CM | POA: Diagnosis not present

## 2024-02-03 DIAGNOSIS — Z712 Person consulting for explanation of examination or test findings: Secondary | ICD-10-CM

## 2024-02-03 NOTE — Patient Instructions (Signed)
 Medication Instructions:  No changes *If you need a refill on your cardiac medications before your next appointment, please call your pharmacy*  Lab Work: none  Testing/Procedures: none  Follow-Up: As needed with Dr. Lonni
